# Patient Record
Sex: Female | Born: 1940 | Race: White | Hispanic: No | Marital: Married | Smoking: Never smoker
Health system: Southern US, Community
[De-identification: ages and names within clinical notes are randomized; demographics above are authoritative.]

## PROBLEM LIST (undated history)

## (undated) DIAGNOSIS — C801 Malignant (primary) neoplasm, unspecified: Secondary | ICD-10-CM

## (undated) DIAGNOSIS — I10 Essential (primary) hypertension: Secondary | ICD-10-CM

## (undated) HISTORY — PX: JOINT REPLACEMENT: SHX530

---

## 2017-12-22 ENCOUNTER — Encounter: Payer: Self-pay | Admitting: Emergency Medicine

## 2017-12-22 ENCOUNTER — Emergency Department (INDEPENDENT_AMBULATORY_CARE_PROVIDER_SITE_OTHER)
Admission: EM | Admit: 2017-12-22 | Discharge: 2017-12-22 | Disposition: A | Discharge status: Home / Self Care | Payer: Medicare Other | Source: Home / Self Care | Attending: Emergency Medicine | Admitting: Emergency Medicine

## 2017-12-22 DIAGNOSIS — N3001 Acute cystitis with hematuria: Secondary | ICD-10-CM

## 2017-12-22 HISTORY — DX: Essential (primary) hypertension: I10

## 2017-12-22 LAB — POCT URINALYSIS DIP (MANUAL ENTRY)
Bilirubin, UA: NEGATIVE
Glucose, UA: NEGATIVE mg/dL
Ketones, POC UA: NEGATIVE mg/dL
Nitrite, UA: NEGATIVE
Protein Ur, POC: NEGATIVE mg/dL
Spec Grav, UA: <=1.005 — AB (ref 1.010–1.025)
Urobilinogen, UA: 0.2 E.U./dL
pH, UA: 6.5 (ref 5.0–8.0)

## 2017-12-22 MED ORDER — CIPROFLOXACIN HCL 250 MG PO TABS: 250.0000 mg | ORAL_TABLET | Freq: Two times a day (BID) | ORAL | 0 refills | Status: DC

## 2017-12-22 NOTE — ED Provider Notes (Signed)
[facility]    CSN: [id number] Arrival date & time: 12/22/17  1607     History   Chief Complaint Chief Complaint  Patient presents with  . Dysuria    HPI Monica Bishop is a 77 y.o. female.   HPI This is a 77 y.o. female who presents today with UTI symptoms for 3 days. -progressively worsening + dysuria + frequency + urgency No hematuria No vaginal discharge No fever/chills No lower abdominal pain,except mild suprapubic pain No nausea No vomiting No back pain No fatigue Has tried over-the-counter measures without improvement.  She states the last UTI she had was 5 years ago and Cipro worked great without side effects and she requests cipro. She is allergic to sulfa.    Past Medical History:  Diagnosis Date  . Hypertension     There are no active problems to display for this patient.   Past Surgical History:  Procedure Laterality Date  . JOINT REPLACEMENT      OB History    No data available       Home Medications    Prior to Admission medications   Medication Sig Start Date End Date Taking? Authorizing Provider  amLODipine-benazepril (LOTREL) 10-20 MG capsule Take 1 capsule by mouth daily.   Yes [provider]  hydrochlorothiazide (HYDRODIURIL) 12.5 MG tablet Take 12.5 mg by mouth daily.   Yes [provider]  losartan (COZAAR) 100 MG tablet Take 100 mg by mouth daily.   Yes [provider]  Metaproterenol Sulfate (ALUPENT) 10 MG/5ML SYRP Take by mouth 3 (three) times daily.   Yes [provider]  ciprofloxacin (CIPRO) 250 MG tablet Take 1 tablet (250 mg total) by mouth 2 (two) times daily. 12/22/17   Jacqulyn Cane, MD    Family History History reviewed. No pertinent family history. Positive for heart disease and cancer in the family Social History Social History   Tobacco Use  . Smoking status: Never Smoker  . Smokeless tobacco: Never Used  Substance Use Topics  . Alcohol use: No   Frequency: Never  . Drug use: No     Allergies   Sulfa antibiotics   Review of Systems Review of Systems  All other systems reviewed and are negative.    Physical Exam Triage Vital Signs ED Triage Vitals [12/22/17 1637]  Enc Vitals Group     BP 130/76     Pulse Rate 65     Resp 16     Temp 98.4 F (36.9 C)     Temp Source Oral     SpO2 98 %     Weight 199 lb (90.3 kg)     Height 5\' 1"  (1.549 m)     Head Circumference      Peak Flow      Pain Score      Pain Loc      Pain Edu?      Excl. in [place]?    No data found.  Updated Vital Signs BP 130/76 (BP Location: Right Arm)   Pulse 65   Temp 98.4 F (36.9 C) (Oral)   Resp 16   Ht 5\' 1"  (1.549 m)   Wt 199 lb (90.3 kg)   SpO2 98%   BMI 37.60 kg/m   Visual Acuity Right Eye Distance:   Left Eye Distance:   Bilateral Distance:    Right Eye Near:   Left Eye Near:    Bilateral Near:     Physical Exam  Constitutional:  She is oriented to person, place, and time. She appears well-developed and well-nourished. No distress.  HENT:  Mouth/Throat: Oropharynx is clear and moist.  Eyes: No scleral icterus.  Neck: Neck supple.  Cardiovascular: Normal rate, regular rhythm and normal heart sounds.  Pulmonary/Chest: Breath sounds normal.  Abdominal: Soft. She exhibits no mass. There is no hepatosplenomegaly. There is tenderness in the suprapubic area. There is no rebound, no guarding and no CVA tenderness.  Lymphadenopathy:    She has no cervical adenopathy.  Neurological: She is alert and oriented to person, place, and time.  Skin: Skin is warm and dry.  Nursing note and vitals reviewed.    UC Treatments / Results  Labs (all labs ordered are listed, but only abnormal results are displayed) Labs Reviewed  POCT URINALYSIS DIP (MANUAL ENTRY) - Abnormal; Notable for the following components:      Result Value   Color, UA yellow (*)    Spec Grav, UA <=1.005 (*)    Blood, UA moderate (*)    Leukocytes, UA Large (3+)  (*)    All other components within normal limits  URINE CULTURE    EKG  EKG Interpretation None       Radiology No results found.  Procedures Procedures (including critical care time)  Medications Ordered in UC Medications - No data to display   Initial Impression / Assessment and Plan / UC Course  I have reviewed the triage vital signs and the nursing notes.  Pertinent labs & imaging results that were available during my care of the patient were reviewed by me and considered in my medical decision making (see chart for details).       Final Clinical Impressions(s) / UC Diagnoses   Final diagnoses:  Acute cystitis with hematuria  risks benefits alternatives discussed.treatment options discussed She is allergic to Septra. She does not want to use cephalexin as that has not been effective at times for prior UTIs. She states Cipro has worked great to cure UTI in the past without side effects.  ED Discharge Orders        Ordered    ciprofloxacin (CIPRO) 250 MG tablet  2 times daily     12/22/17 1701    twice a day 7 days  Other symptomatic care discussed,such as pushing fluids. Urine culture sent Follow-up with your primary care doctor in 5-7 days if not improving, or sooner if symptoms become worse. Precautions discussed. Red flags discussed. Questions invited and answered. Patient voiced understanding and agreement.   Controlled Substance Prescriptions [place] Controlled Substance Registry consulted? Not Applicable   Jacqulyn Cane, MD 12/22/17 2311

## 2017-12-22 NOTE — ED Triage Notes (Signed)
Patient presents to [facility] with C/O dysuria since Friday.

## 2017-12-24 ENCOUNTER — Telehealth: Payer: Self-pay

## 2017-12-24 LAB — URINE CULTURE
MICRO NUMBER:: 90305866
SPECIMEN QUALITY:: ADEQUATE

## 2017-12-24 NOTE — Telephone Encounter (Signed)
Pt feeling better.  Lab results given, and pt instructed to complete antibiotic course.  Will follow up as needed.

## 2018-10-07 DIAGNOSIS — C801 Malignant (primary) neoplasm, unspecified: Secondary | ICD-10-CM

## 2018-10-07 HISTORY — DX: Malignant (primary) neoplasm, unspecified: C80.1

## 2018-10-15 HISTORY — PX: BREAST LUMPECTOMY: SHX2

## 2018-12-19 ENCOUNTER — Emergency Department (INDEPENDENT_AMBULATORY_CARE_PROVIDER_SITE_OTHER)
Admission: EM | Admit: 2018-12-19 | Discharge: 2018-12-19 | Disposition: A | Discharge status: Home / Self Care | Payer: Medicare Other | Source: Home / Self Care

## 2018-12-19 ENCOUNTER — Emergency Department (INDEPENDENT_AMBULATORY_CARE_PROVIDER_SITE_OTHER): Payer: Medicare Other

## 2018-12-19 ENCOUNTER — Encounter: Payer: Self-pay | Admitting: *Deleted

## 2018-12-19 ENCOUNTER — Other Ambulatory Visit: Payer: Self-pay

## 2018-12-19 DIAGNOSIS — R05 Cough: Secondary | ICD-10-CM

## 2018-12-19 DIAGNOSIS — R0989 Other specified symptoms and signs involving the circulatory and respiratory systems: Secondary | ICD-10-CM | POA: Diagnosis not present

## 2018-12-19 DIAGNOSIS — R053 Chronic cough: Secondary | ICD-10-CM

## 2018-12-19 DIAGNOSIS — J069 Acute upper respiratory infection, unspecified: Secondary | ICD-10-CM | POA: Diagnosis not present

## 2018-12-19 HISTORY — DX: Malignant (primary) neoplasm, unspecified: C80.1

## 2018-12-19 MED ORDER — AZITHROMYCIN 250 MG PO TABS: 250.0000 mg | ORAL_TABLET | Freq: Every day | ORAL | 0 refills | Status: DC

## 2018-12-19 MED ORDER — PREDNISONE 20 MG PO TABS: ORAL_TABLET | ORAL | 0 refills | Status: DC

## 2018-12-19 MED ORDER — ALBUTEROL SULFATE HFA 108 (90 BASE) MCG/ACT IN AERS: 1.0000 | INHALATION_SPRAY | Freq: Four times a day (QID) | RESPIRATORY_TRACT | 0 refills | Status: DC | PRN

## 2018-12-19 NOTE — ED Provider Notes (Signed)
[facility]    CSN: [id number] Arrival date & time: 12/19/18  1126     History   Chief Complaint Chief Complaint  Patient presents with  . Cough    HPI Monica Bishop is a 78 y.o. female.   HPI Monica Bishop is a 78 y.o. female presenting to UC with c/o persistent cough for 2 weeks despite completing a 7 day course of doxycycline on 12/17/2018 prescribed by her PCP for bronchitis. She has also tried tessalon and a prescription cough syrup but no relief. Her husband encouraged her to come be evaluated. Denies fever, chills, n/v/d. No chest pain or SOB.     Past Medical History:  Diagnosis Date  . 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 Hypertension     There are no active problems to display for this patient.   Past Surgical History:  Procedure Laterality Date  . BREAST LUMPECTOMY  10/2018  . JOINT REPLACEMENT      OB History   No obstetric history on file.      Home Medications    Prior to Admission medications   Medication Sig Start Date End Date Taking? Authorizing Provider  albuterol (PROVENTIL HFA;VENTOLIN HFA) 108 (90 Base) MCG/ACT inhaler Inhale 1-2 puffs into the lungs every 6 (six) hours as needed for wheezing or shortness of breath. 12/19/18   Noe Gens, PA-C  amLODipine-benazepril (LOTREL) 10-20 MG capsule Take 1 capsule by mouth daily.    [provider]  azithromycin (ZITHROMAX) 250 MG tablet Take 1 tablet (250 mg total) by mouth daily. Take first 2 tablets together, then 1 every day until finished. 12/19/18   Noe Gens, PA-C  hydrochlorothiazide (HYDRODIURIL) 12.5 MG tablet Take 12.5 mg by mouth daily.    [provider]  losartan (COZAAR) 100 MG tablet Take 100 mg by mouth daily.    [provider]  predniSONE (DELTASONE) 20 MG tablet 3 tabs po day one, then 2 po daily x 4 days 12/19/18   Noe Gens, PA-C    Family History History reviewed. No pertinent family history.  Social History Social History    Tobacco Use  . Smoking status: Never Smoker  . Smokeless tobacco: Never Used  Substance Use Topics  . Alcohol use: No    Frequency: Never  . Drug use: No     Allergies   Hydrocodone and Sulfa antibiotics   Review of Systems Review of Systems  Constitutional: Negative for chills and fever.  HENT: Positive for congestion. Negative for ear pain, sore throat, trouble swallowing and voice change.   Respiratory: Positive for cough. Negative for shortness of breath and wheezing.   Cardiovascular: Negative for chest pain and palpitations.  Gastrointestinal: Negative for abdominal pain, diarrhea, nausea and vomiting.  Musculoskeletal: Negative for arthralgias, back pain and myalgias.  Skin: Negative for rash.     Physical Exam Triage Vital Signs ED Triage Vitals [12/19/18 1145]  Enc Vitals Group     BP (!) 147/78     Pulse Rate 60     Resp 18     Temp 97.6 F (36.4 C)     Temp Source Oral     SpO2 96 %     Weight 200 lb (90.7 kg)     Height 5\' 2"  (1.575 m)     Head Circumference      Peak Flow      Pain Score 0     Pain Loc  Pain Edu?      Excl. in [place]?    No data found.  Updated Vital Signs BP (!) 147/78 (BP Location: Right Arm)   Pulse 60   Temp 97.6 F (36.4 C) (Oral)   Resp 18   Ht 5\' 2"  (1.575 m)   Wt 200 lb (90.7 kg)   SpO2 96%   BMI 36.58 kg/m   Visual Acuity Right Eye Distance:   Left Eye Distance:   Bilateral Distance:    Right Eye Near:   Left Eye Near:    Bilateral Near:     Physical Exam Vitals signs and nursing note reviewed.  Constitutional:      Appearance: Normal appearance. She is well-developed.  HENT:     Head: Normocephalic and atraumatic.     Right Ear: Tympanic membrane normal.     Left Ear: Tympanic membrane normal.     Nose: Congestion present.     Mouth/Throat:     Lips: Pink.     Mouth: Mucous membranes are moist.     Pharynx: Oropharynx is clear. Uvula midline.  Neck:     Musculoskeletal: Normal range of  motion.  Cardiovascular:     Rate and Rhythm: Normal rate and regular rhythm.  Pulmonary:     Effort: Pulmonary effort is normal.     Breath sounds: Wheezing, rhonchi and rales present.     Comments: Wheeze and coarse breath sounds in lower lung fields bilaterally. No respiratory distress. Occasional productive cough on exam. Musculoskeletal: Normal range of motion.  Skin:    General: Skin is warm and dry.  Neurological:     Mental Status: She is alert and oriented to person, place, and time.  Psychiatric:        Behavior: Behavior normal.      UC Treatments / Results  Labs (all labs ordered are listed, but only abnormal results are displayed) Labs Reviewed - No data to display  EKG None  Radiology Dg Chest 2 View  Result Date: 12/19/2018 CLINICAL DATA:  Cough and congestion for 2 weeks. EXAM: CHEST - 2 VIEW COMPARISON:  None. FINDINGS: The heart size and mediastinal contours are within normal limits. Both lungs are clear. Degenerative joint changes of the spine are noted. IMPRESSION: No active cardiopulmonary disease. Electronically Signed   By: Abelardo Diesel M.D.   On: 12/19/2018 12:13    Procedures Procedures (including critical care time)  Medications Ordered in UC Medications - No data to display  Initial Impression / Assessment and Plan / UC Course  I have reviewed the triage vital signs and the nursing notes.  Pertinent labs & imaging results that were available during my care of the patient were reviewed by me and considered in my medical decision making (see chart for details).     CXR- no pneumonia, however, there are coarse breath sounds in lower lung fields, will cover for atypical bacteria  Home care info provided  Final Clinical Impressions(s) / UC Diagnoses   Final diagnoses:  Upper respiratory tract infection, unspecified type  Persistent cough   Discharge Instructions   None    ED Prescriptions    Medication Sig Dispense Auth. Provider    azithromycin (ZITHROMAX) 250 MG tablet Take 1 tablet (250 mg total) by mouth daily. Take first 2 tablets together, then 1 every day until finished. 6 tablet Gerarda Fraction, Corynne Scibilia O, PA-C   predniSONE (DELTASONE) 20 MG tablet 3 tabs po day one, then 2 po daily x 4 days 11  tablet Gerarda Fraction, Minie Roadcap O, PA-C   albuterol (PROVENTIL HFA;VENTOLIN HFA) 108 (90 Base) MCG/ACT inhaler Inhale 1-2 puffs into the lungs every 6 (six) hours as needed for wheezing or shortness of breath. 1 Inhaler Noe Gens, PA-C     Controlled Substance Prescriptions [place] Controlled Substance Registry consulted? Not Applicable   Tyrell Antonio 12/19/18 1553

## 2018-12-19 NOTE — ED Triage Notes (Signed)
Pt c/o cough x 2 wks. She completed 7 days of Doxycycline on 12/17/18 from her PCP for bronchitis. She is also taking a prescription cough syrup.

## 2019-07-30 IMAGING — DX DG CHEST 2V
2 series · 2 of 2 positions shown · non-contrast
Comparison: None.

CLINICAL DATA: Cough and congestion for 2 weeks.

EXAM:
CHEST - 2 VIEW

[chest pa]
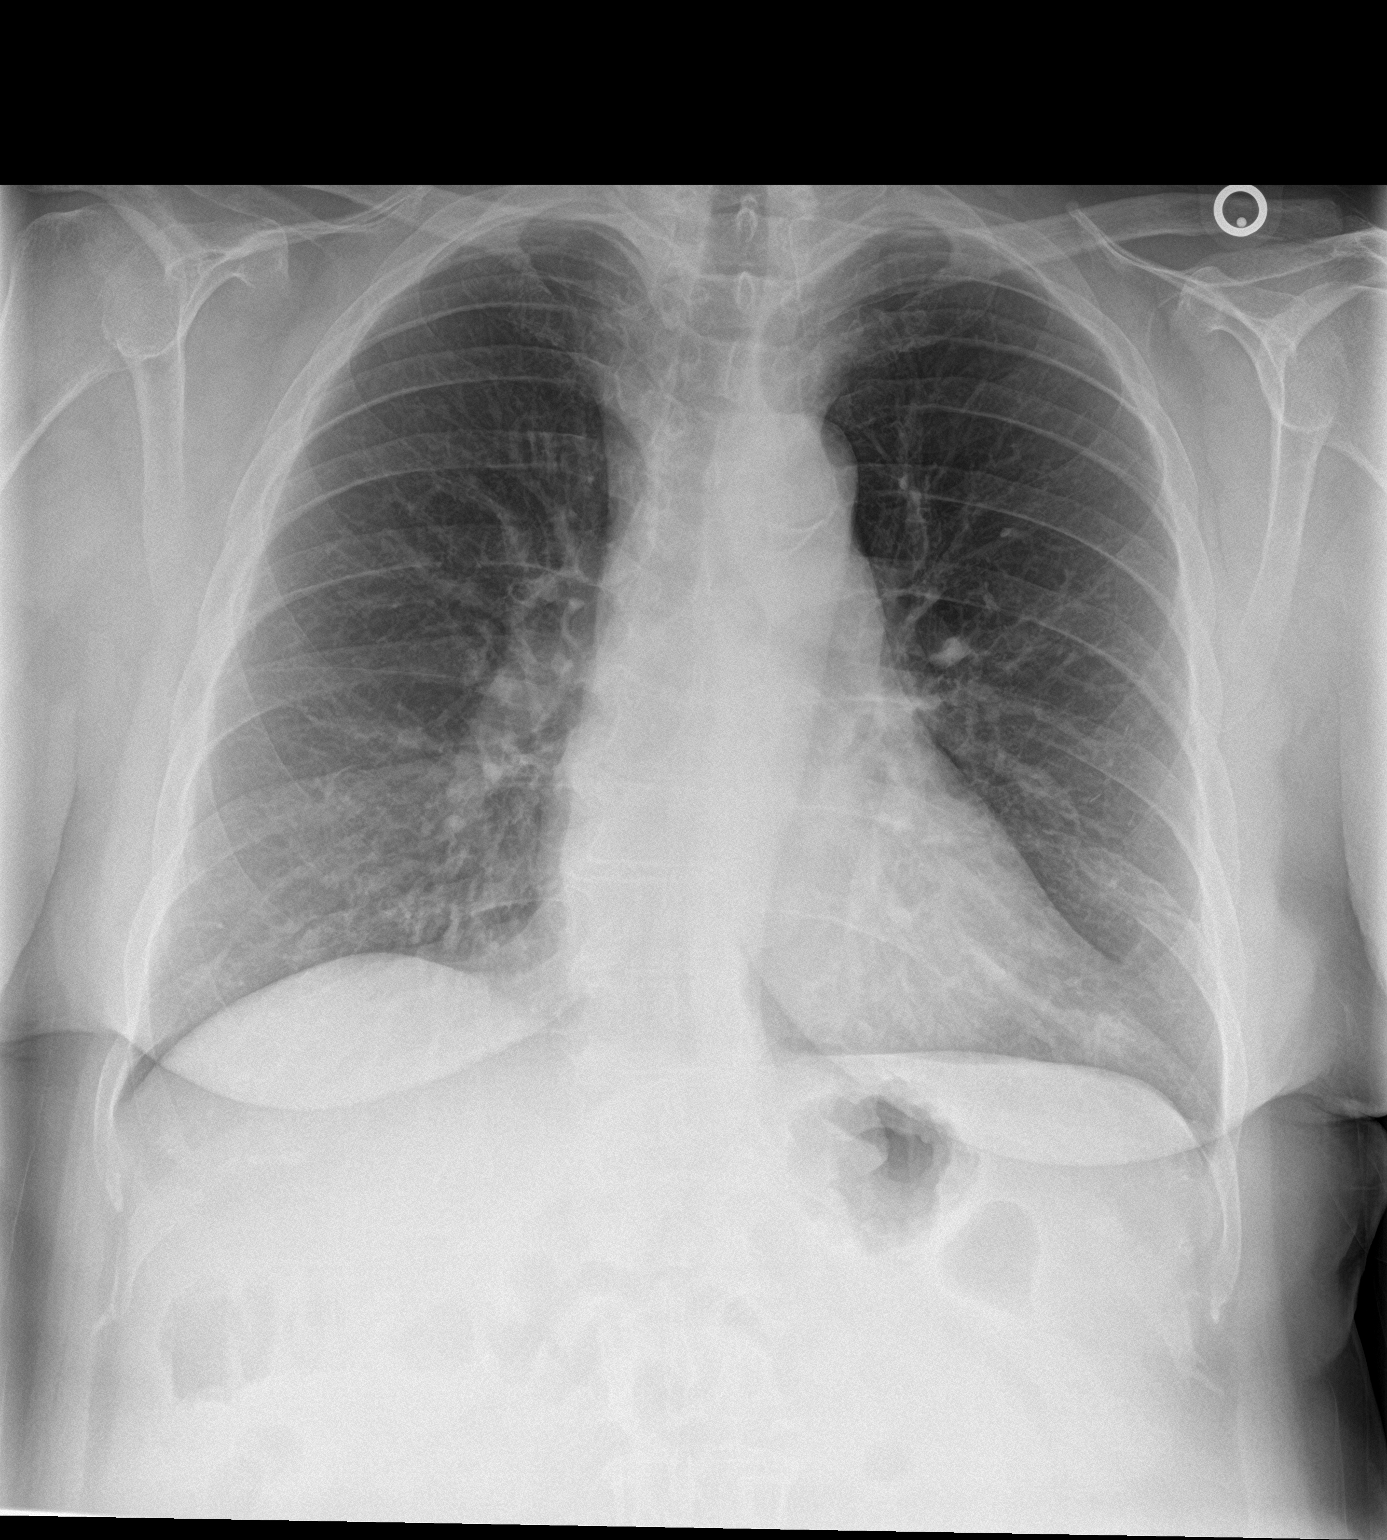

[chest lat]
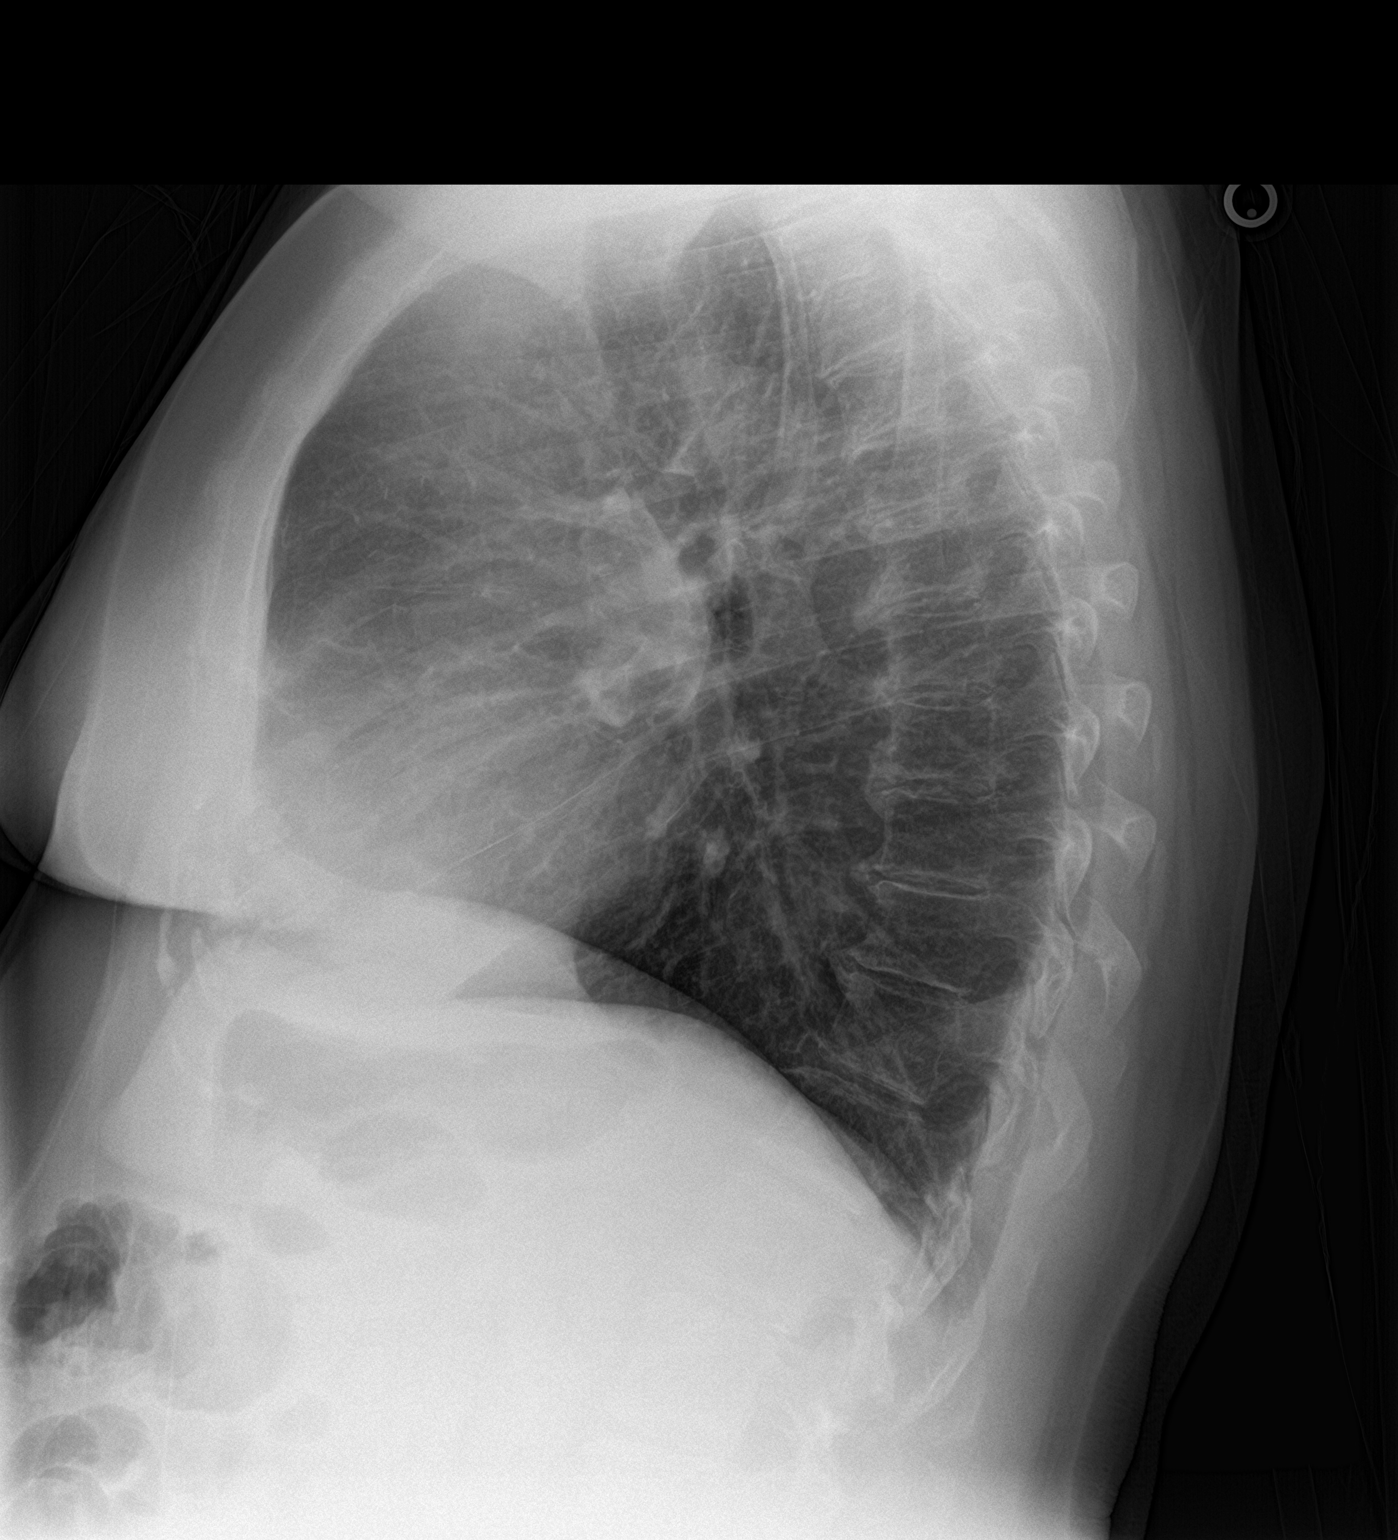

[2 of 2 positions shown; findings below may reference images not displayed]

FINDINGS: The heart size and mediastinal contours are within normal limits.
Both lungs are clear. Degenerative joint changes of the spine are
noted.
IMPRESSION: No active cardiopulmonary disease.

## 2022-10-01 ENCOUNTER — Ambulatory Visit (INDEPENDENT_AMBULATORY_CARE_PROVIDER_SITE_OTHER): Payer: Medicare Other

## 2022-10-01 ENCOUNTER — Ambulatory Visit
Admission: EM | Admit: 2022-10-01 | Discharge: 2022-10-01 | Disposition: A | Discharge status: Home / Self Care | Payer: Medicare Other | Attending: Family Medicine | Admitting: Family Medicine

## 2022-10-01 DIAGNOSIS — M545 Low back pain, unspecified: Secondary | ICD-10-CM

## 2022-10-01 DIAGNOSIS — M4310 Spondylolisthesis, site unspecified: Secondary | ICD-10-CM

## 2022-10-01 DIAGNOSIS — M5442 Lumbago with sciatica, left side: Secondary | ICD-10-CM | POA: Diagnosis not present

## 2022-10-01 DIAGNOSIS — M4150 Other secondary scoliosis, site unspecified: Secondary | ICD-10-CM

## 2022-10-01 MED ORDER — METHYLPREDNISOLONE ACETATE 80 MG/ML IJ SUSP
80.0000 mg | Freq: Once | INTRAMUSCULAR | Status: AC
  Administered 2022-10-01: 80 mg via INTRAMUSCULAR

## 2022-10-01 MED ORDER — TRAMADOL HCL 50 MG PO TABS: 50.0000 mg | ORAL_TABLET | Freq: Four times a day (QID) | ORAL | 0 refills | Status: AC | PRN

## 2022-10-01 MED ORDER — TIZANIDINE HCL 4 MG PO TABS: 4.0000 mg | ORAL_TABLET | Freq: Four times a day (QID) | ORAL | 0 refills | Status: AC | PRN

## 2022-10-01 NOTE — Discharge Instructions (Signed)
The steroid shot should help reduce pain and inflammation Take muscle relaxer tizanidine as needed for muscle tightness and spasm Take tramadol if needed for severe pain.  Take tramadol with food

## 2022-10-01 NOTE — ED Triage Notes (Signed)
Pt presents with lower back pain that has worsened since Saturday AM. Pt endorses pain has radiated down her leg. NKI.

## 2022-10-01 NOTE — ED Provider Notes (Signed)
[facility]    CSN: [id number] Arrival date & time: 10/01/22  0949      History   Chief Complaint Chief Complaint  Patient presents with   Back Pain    HPI Monica Bishop is a 81 y.o. female.   HPI  Patient is here for back pain.  She states it has been bothering her since Friday.  During the day Friday she noticed some mild achiness.  Saturday when she woke up she could hardly move.  Sunday was about the same.  She took 2 of her husbands muscle relaxers and a pain pill over the last 3 days just with get the pain improved.  She states her current pain is quite severe.  It is in the center of her low back.  No accident, no injury, no bending or lifting, no fall.  No prior history of back problems or back x-rays.  Pain radiates from the central low back around to the left lateral thigh to about mid thigh No numbness.  No weakness.  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   Past Surgical History:  Procedure Laterality Date   BREAST LUMPECTOMY  10/2018   JOINT REPLACEMENT      OB History   No obstetric history on file.      Home Medications    Prior to Admission medications   Medication Sig Start Date End Date Taking? Authorizing Provider  tiZANidine (ZANAFLEX) 4 MG tablet Take 1-2 tablets (4-8 mg total) by mouth every 6 (six) hours as needed for muscle spasms. 10/01/22  Yes Raylene Everts, MD  traMADol (ULTRAM) 50 MG tablet Take 1 tablet (50 mg total) by mouth every 6 (six) hours as needed. 10/01/22  Yes Raylene Everts, MD  amLODipine-benazepril (LOTREL) 10-20 MG capsule Take 1 capsule by mouth daily.    [provider]  hydrochlorothiazide (HYDRODIURIL) 12.5  MG tablet Take 12.5 mg by mouth daily.    [provider]  losartan (COZAAR) 100 MG tablet Take 100 mg by mouth daily.    [provider]    Family History History reviewed. No pertinent family history.  Social History Social History   Tobacco Use   Smoking status: Never   Smokeless tobacco: Never  Vaping Use   Vaping Use: Never used  Substance Use Topics   Alcohol use: No   Drug use: No     Allergies   Hydrocodone and Sulfa antibiotics   Review of Systems Review of Systems  See HPI Physical Exam Triage Vital Signs ED Triage Vitals  Enc Vitals Group     BP 10/01/22 1002 119/69     Pulse Rate 10/01/22 1002 62     Resp 10/01/22 1002 14     Temp 10/01/22 1002 99.5 F (37.5 C)     Temp Source 10/01/22 1002 Oral     SpO2 10/01/22 1002 98 %     Weight --      Height --      Head Circumference --      Peak Flow --      Pain Score 10/01/22 1001 10     Pain Loc --  Pain Edu? --      Excl. in [place]? --    No data found.  Updated Vital Signs BP 119/69 (BP Location: Right Arm)   Pulse 62   Temp 99.5 F (37.5 C) (Oral)   Resp 14   SpO2 98%      Physical Exam Constitutional:      General: She is not in acute distress.    Appearance: She is well-developed. She is ill-appearing.     Comments: Stiff and guarded movements.  Patient is examined in exam chair due to inability to get on exam table.  Appears uncomfortable  HENT:     Head: Normocephalic and atraumatic.  Eyes:     Conjunctiva/sclera: Conjunctivae normal.     Pupils: Pupils are equal, round, and reactive to light.  Cardiovascular:     Rate and Rhythm: Normal rate.  Pulmonary:     Effort: Pulmonary effort is normal. No respiratory distress.  Abdominal:     General: There is no distension.     Palpations: Abdomen is soft.  Musculoskeletal:        General: Normal range of motion.     Cervical back: Normal range of motion.     Comments: Tenderness centrally over the L5-S1  junction.  No tenderness over the SI joints.  Mild increased tension in the lumbar muscles.  Very limited range of motion due to pain.  Reflexes intact both knees and ankle.  Sensory exam intact.  Strength appears intact.  Straight leg raise is negative bilaterally.  At full leg extension on the left patient complains of increased low back pain  Skin:    General: Skin is warm and dry.  Neurological:     Mental Status: She is alert.     Gait: Gait abnormal.      UC Treatments / Results  Labs (all labs ordered are listed, but only abnormal results are displayed) Labs Reviewed - No data to display  EKG   Radiology DG Lumbar Spine Complete  Result Date: 10/01/2022 CLINICAL DATA:  Severe lumbar region back pain EXAM: LUMBAR SPINE - COMPLETE 4+ VIEW COMPARISON:  04/18/2018 FINDINGS: Five lumbar type vertebral bodies with scoliotic curvature convex to the left. Degenerative anterolisthesis at L4-5 measuring 6 mm. Disc space narrowing throughout the lumbar region. Lower lumbar facet osteoarthritis. Regional arterial calcification incidentally noted. IMPRESSION: Lumbar scoliotic curvature convex to the left with degenerative disc disease and degenerative facet disease. 6 mm of degenerative anterolisthesis at L4-5. Electronically Signed   By: Homar Weinkauf Chimes M.D.   On: 10/01/2022 10:54    Procedures Procedures (including critical care time)  Medications Ordered in UC Medications  methylPREDNISolone acetate (DEPO-MEDROL) injection 80 mg (80 mg Intramuscular Given 10/01/22 1113)    Initial Impression / Assessment and Plan / UC Course  I have reviewed the triage vital signs and the nursing notes.  Pertinent labs & imaging results that were available during my care of the patient were reviewed by me and considered in my medical decision making (see chart for details).     Pictures are shown to patient.  Listhesis and scoliosis deformities are described and pointed out.  Emphasized that nothing  surgical was severely damaged.  See Ortho if fails to improve Final Clinical Impressions(s) / UC Diagnoses   Final diagnoses:  Acute midline low back pain with left-sided sciatica  Degenerative spondylolisthesis  Degenerative scoliosis     Discharge Instructions      The steroid shot should help reduce pain  and inflammation Take muscle relaxer tizanidine as needed for muscle tightness and spasm Take tramadol if needed for severe pain.  Take tramadol with food     ED Prescriptions     Medication Sig Dispense Auth. Provider   tiZANidine (ZANAFLEX) 4 MG tablet Take 1-2 tablets (4-8 mg total) by mouth every 6 (six) hours as needed for muscle spasms. 21 tablet Raylene Everts, MD   traMADol (ULTRAM) 50 MG tablet Take 1 tablet (50 mg total) by mouth every 6 (six) hours as needed. 15 tablet Raylene Everts, MD      I have reviewed the PDMP during this encounter.   Raylene Everts, MD 10/01/22 [phone]

## 2023-06-25 ENCOUNTER — Ambulatory Visit
Admission: EM | Admit: 2023-06-25 | Discharge: 2023-06-25 | Disposition: A | Discharge status: Home / Self Care | Payer: Medicare Other | Attending: Family Medicine | Admitting: Family Medicine

## 2023-06-25 DIAGNOSIS — R198 Other specified symptoms and signs involving the digestive system and abdomen: Secondary | ICD-10-CM | POA: Diagnosis not present

## 2023-06-25 DIAGNOSIS — R1013 Epigastric pain: Secondary | ICD-10-CM

## 2023-06-25 NOTE — ED Triage Notes (Signed)
Pt c/o stomach pain x 3 days. Had recent knee surgery and has been having issues with constipation. Also had diverticulitis. Had a normal bowl movement this am. Denies dysuria.

## 2023-06-25 NOTE — Discharge Instructions (Signed)
You need to go to the ER for evaluation of your aorta

## 2023-06-25 NOTE — ED Provider Notes (Signed)
[facility]    CSN: [id number] Arrival date & time: 06/25/23  1126      History   Chief Complaint Chief Complaint  Patient presents with   Abdominal Pain    Stomach    HPI Monica Bishop is a 82 y.o. female.   Patient is here for acute pain.  She states it has been building over the last 2 to 3 days.  She points to her epigastrium as the area of pain.  She states the pain kept her awake all night.  She tried Tums but did not get relief.  She did see an emergency physician for abdominal pain last month.  She was found to have diverticulitis and constipation.  These were treated and she got better.  This is a different pain than before. She states she is under a lot of stress.  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  She is still in physical therapy.  She is not taking any narcotic pain medication. Patient is cared for through the [facility].  Review the medical record reveals that she has hypertension and hyperlipidemia.  She states her hyperlipidemia is diet controlled because she refuses statins.    Past Medical History:  Diagnosis Date   Cancer (HCC) 10/07/2018   breast   Hypertension     There are no problems to display for this patient.   Past Surgical History:  Procedure Laterality Date   BREAST LUMPECTOMY  10/2018   JOINT REPLACEMENT      OB History   No obstetric history on file.      Home Medications    Prior to Admission medications   Medication Sig Start Date End Date Taking? Authorizing Provider  diclofenac (VOLTAREN) 75 MG EC tablet Take by mouth. 06/11/23  Yes [provider]  amLODipine-benazepril (LOTREL) 10-20 MG capsule Take 1 capsule by mouth daily.    [provider]  aspirin 81 MG chewable tablet Chew 1 tablet by mouth daily.    [provider]  hydrochlorothiazide (HYDRODIURIL) 12.5 MG tablet Take 12.5 mg by mouth daily.    [provider]  losartan (COZAAR) 100 MG tablet Take 100 mg by  mouth daily.    [provider]  tiZANidine (ZANAFLEX) 4 MG tablet Take 1-2 tablets (4-8 mg total) by mouth every 6 (six) hours as needed for muscle spasms. 10/01/22   [name], MD  traMADol (ULTRAM) 50 MG tablet Take 1 tablet (50 mg total) by mouth every 6 (six) hours as needed. 10/01/22   [name], MD    Family History History reviewed. No pertinent family history.  Social History Social History   Tobacco Use   Smoking status: Never   Smokeless tobacco: Never  Vaping Use   Vaping status: Never Used  Substance Use Topics   Alcohol use: No   Drug use: No     Allergies   Hydrocodone and Sulfa antibiotics   Review of Systems Review of Systems See HPI  Physical Exam   Updated Vital Signs BP (!) 151/80 (BP Location: Left Arm)   Pulse 64   Temp 98.2 F (36.8 C) (Oral)   Resp 17   SpO2 97%       Physical Exam Constitutional:      General: She is not in acute distress.    Appearance: She is well-developed. She is ill-appearing.     Comments: Patient sits in exam chair bent over clutching abdomen  HENT:  Head: Normocephalic and atraumatic.     Mouth/Throat:     Pharynx: Oropharynx is clear.  Eyes:     Conjunctiva/sclera: Conjunctivae normal.     Pupils: Pupils are equal, round, and reactive to light.  Cardiovascular:     Rate and Rhythm: Normal rate and regular rhythm.     Heart sounds: Murmur heard.     Comments: Pronounced systolic murmur Pulmonary:     Effort: Pulmonary effort is normal. No respiratory distress.     Breath sounds: Normal breath sounds.  Chest:     Chest wall: No tenderness.  Abdominal:     General: Abdomen is flat. Bowel sounds are normal. There is no distension.     Palpations: Abdomen is soft. There is pulsatile mass.     Tenderness: There is abdominal tenderness in the epigastric area.  Musculoskeletal:        General: Normal range of motion.     Cervical back: Normal range of motion.  Skin:     General: Skin is warm and dry.  Neurological:     Mental Status: She is alert.      UC Treatments / Results  Labs (all labs ordered are listed, but only abnormal results are displayed) Labs Reviewed - No data to display  EKG   Radiology No results found.  Procedures Procedures (including critical care time)  Medications Ordered in UC Medications - No data to display  Initial Impression / Assessment and Plan / UC Course  I have reviewed the triage vital signs and the nursing notes.  Pertinent labs & imaging results that were available during my care of the patient were reviewed by me and considered in my medical decision making (see chart for details).     The strong pulse in her abdomen in the epigastric region, and in the area for tenderness, gives me concern for aortic aneurysm.  Patient was sent to the emergency room for evaluation.  The emergency room physician was called and report given Final Clinical Impressions(s) / UC Diagnoses   Final diagnoses:  Abdominal pain, epigastric  Pulsatile abdomen     Discharge Instructions      You need to go to the ER for evaluation of your aorta     ED Prescriptions   None    PDMP not reviewed this encounter.   [name], MD 06/25/23 [phone]
